# Patient Record
Sex: Male | Born: 1995 | Race: White | Hispanic: Yes | Marital: Single | Smoking: Never smoker
Health system: Southern US, Community
[De-identification: ages and names within clinical notes are randomized; demographics above are authoritative.]

---
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
## 2015-08-21 ENCOUNTER — Emergency Department (HOSPITAL_COMMUNITY)
Admission: EM | Admit: 2015-08-21 | Discharge: 2015-08-22 | Disposition: A | Discharge status: Home / Self Care | Payer: 59 | Attending: Emergency Medicine | Admitting: Emergency Medicine

## 2015-08-21 ENCOUNTER — Encounter (HOSPITAL_COMMUNITY): Payer: Self-pay | Admitting: *Deleted

## 2015-08-21 ENCOUNTER — Emergency Department (HOSPITAL_COMMUNITY): Payer: 59

## 2015-08-21 DIAGNOSIS — R05 Cough: Secondary | ICD-10-CM | POA: Diagnosis present

## 2015-08-21 DIAGNOSIS — F17200 Nicotine dependence, unspecified, uncomplicated: Secondary | ICD-10-CM | POA: Diagnosis not present

## 2015-08-21 DIAGNOSIS — J189 Pneumonia, unspecified organism: Secondary | ICD-10-CM

## 2015-08-21 DIAGNOSIS — J159 Unspecified bacterial pneumonia: Secondary | ICD-10-CM | POA: Diagnosis not present

## 2015-08-21 NOTE — ED Notes (Signed)
The party he was at was outside and he was there for 2 hours without a coat cold and coughing

## 2015-08-21 NOTE — ED Notes (Signed)
The pt has had a cold and cough for 2 weedks he was at a party tonight  And he  Did not have a coat  Runny nose hyperventilating.  Chest pain when he coughs.  Productive cough thick yellow mucousn  Worse for 2 days

## 2015-08-22 MED ORDER — AZITHROMYCIN 250 MG PO TABS
500.0000 mg | ORAL_TABLET | Freq: Once | ORAL | Status: AC
  Administered 2015-08-22: 500 mg via ORAL
  Filled 2015-08-22: qty 2

## 2015-08-22 MED ORDER — AZITHROMYCIN 250 MG PO TABS: 250.0000 mg | ORAL_TABLET | Freq: Every day | ORAL | Status: AC

## 2015-08-22 MED ORDER — HYDROCODONE-ACETAMINOPHEN 5-325 MG PO TABS: 1.0000 | ORAL_TABLET | ORAL | Status: AC | PRN

## 2015-08-22 MED ORDER — KETOROLAC TROMETHAMINE 60 MG/2ML IM SOLN
60.0000 mg | Freq: Once | INTRAMUSCULAR | Status: AC
  Administered 2015-08-22: 60 mg via INTRAMUSCULAR
  Filled 2015-08-22: qty 2

## 2015-08-22 NOTE — ED Notes (Signed)
Pt stable, ambulatory, states understanding of discharge instructions 

## 2015-08-22 NOTE — ED Provider Notes (Addendum)
CSN: [id number]     Arrival date & time 08/21/15  2222 History   First MD Initiated Contact with Patient 08/21/15 2355     Chief Complaint  Patient presents with  . URI     (Consider location/radiation/quality/duration/timing/severity/associated sxs/prior Treatment) Patient is a 20 y.o. male presenting with URI. The history is provided by the patient.  URI Presenting symptoms: congestion, cough and fever   Cough:    Cough characteristics:  Productive   Sputum characteristics:  Yellow   Severity:  Moderate   Onset quality:  Gradual   Duration:  2 weeks   Timing:  Constant   Progression:  Unchanged Severity:  Moderate Onset quality:  Gradual Timing:  Constant Progression:  Worsening Worsened by:  Nothing tried Ineffective treatments:  None tried Associated symptoms: no wheezing   Associated symptoms comment:  Tonight while patient was at a wedding he was outside in the cold when he started to feel chest pain and tightness. Now his chest is a 10 out of 10 sharp stabbing pain in the right side of his chest that is worse with deep breathing and coughing. He denies any nausea, vomiting. No recent hospitalizations or travel. No unilateral leg pain or swelling. Risk factors: no chronic kidney disease, no chronic respiratory disease, no diabetes mellitus, no recent travel and no sick contacts   Risk factors comment:  Does use tobacco and marijuana   History reviewed. No pertinent past medical history. History reviewed. No pertinent past surgical history. No family history on file. Social History  Substance Use Topics  . Smoking status: Never Smoker   . Smokeless tobacco: None  . Alcohol Use: Yes    Review of Systems  Constitutional: Positive for fever.  HENT: Positive for congestion.   Respiratory: Positive for cough. Negative for wheezing.   All other systems reviewed and are negative.     Allergies  Review of patient's allergies indicates no known allergies.  Home  Medications   Prior to Admission medications   Not on File   BP 136/81 mmHg  Pulse 92  Temp(Src) 98.5 F (36.9 C) (Oral)  Resp 24  Ht 6\' 2"  (1.88 m)  Wt 196 lb (88.905 kg)  BMI 25.15 kg/m2  SpO2 98% Physical Exam  Constitutional: He is oriented to person, place, and time. He appears well-developed and well-nourished. He appears distressed.  Appears uncomfortable  HENT:  Head: Normocephalic and atraumatic.  Mouth/Throat: Oropharynx is clear and moist.  Eyes: Conjunctivae and EOM are normal. Pupils are equal, round, and reactive to light.  Neck: Normal range of motion. Neck supple.  Cardiovascular: Normal rate, regular rhythm and intact distal pulses.   No murmur heard. Pulmonary/Chest: Effort normal and breath sounds normal. No respiratory distress. He has no wheezes. He has no rales. He exhibits tenderness. He exhibits no crepitus.    Abdominal: Soft. He exhibits no distension. There is no tenderness. There is no rebound and no guarding.  Musculoskeletal: Normal range of motion. He exhibits no edema or tenderness.  Neurological: He is alert and oriented to person, place, and time.  Skin: Skin is warm and dry. No rash noted. No erythema.  Psychiatric: He has a normal mood and affect. His behavior is normal.  Nursing note and vitals reviewed.   ED Course  Procedures (including critical care time) Labs Review Labs Reviewed - No data to display  Imaging Review Dg Chest 2 View  08/21/2015  CLINICAL DATA:  Right-sided chest pain and cough EXAM: CHEST  2 VIEW COMPARISON:  None. FINDINGS: Heart size is normal. No pleural effusion or edema. Airspace consolidation involving the right middle lobe is identified. Left lung is clear. IMPRESSION: 1. Right middle lobe pneumonia. Electronically Signed   By: Signa Kell M.D.   On: 08/21/2015 23:51   I have personally reviewed and evaluated these images and lab results as part of my medical decision-making.   EKG Interpretation None        MDM   Final diagnoses:  CAP (community acquired pneumonia)    Patient presenting with persistent 2 weeks of productive cough and fever that has resolved presenting today with chest pain that started tonight with persistent cough. Patient appears uncomfortable on exam and has pleuritic type pain. Oxygen saturation is 98% temperature is normal blood pressure is normal. He has slightly tachypnea but feel that is related to pain. He is a smoker and occasionally uses marijuana but denies a history of asthma or any significant medical problems. No recent travel or symptoms concerning for PE. On x-ray patient has a right middle lobe pneumonia which correlates with where he is feeling the pain. Patient has no risk factors for resistant pneumonia and was treated with azithromycin.    Gwyneth Sprout, MD 08/22/15 1610  Gwyneth Sprout, MD 08/22/15 [id number]

## 2016-10-28 IMAGING — CR DG CHEST 2V
2 series · 2 of 2 positions shown · non-contrast
Comparison: None.

CLINICAL DATA: Right-sided chest pain and cough

EXAM:
CHEST  2 VIEW

[chest pa]
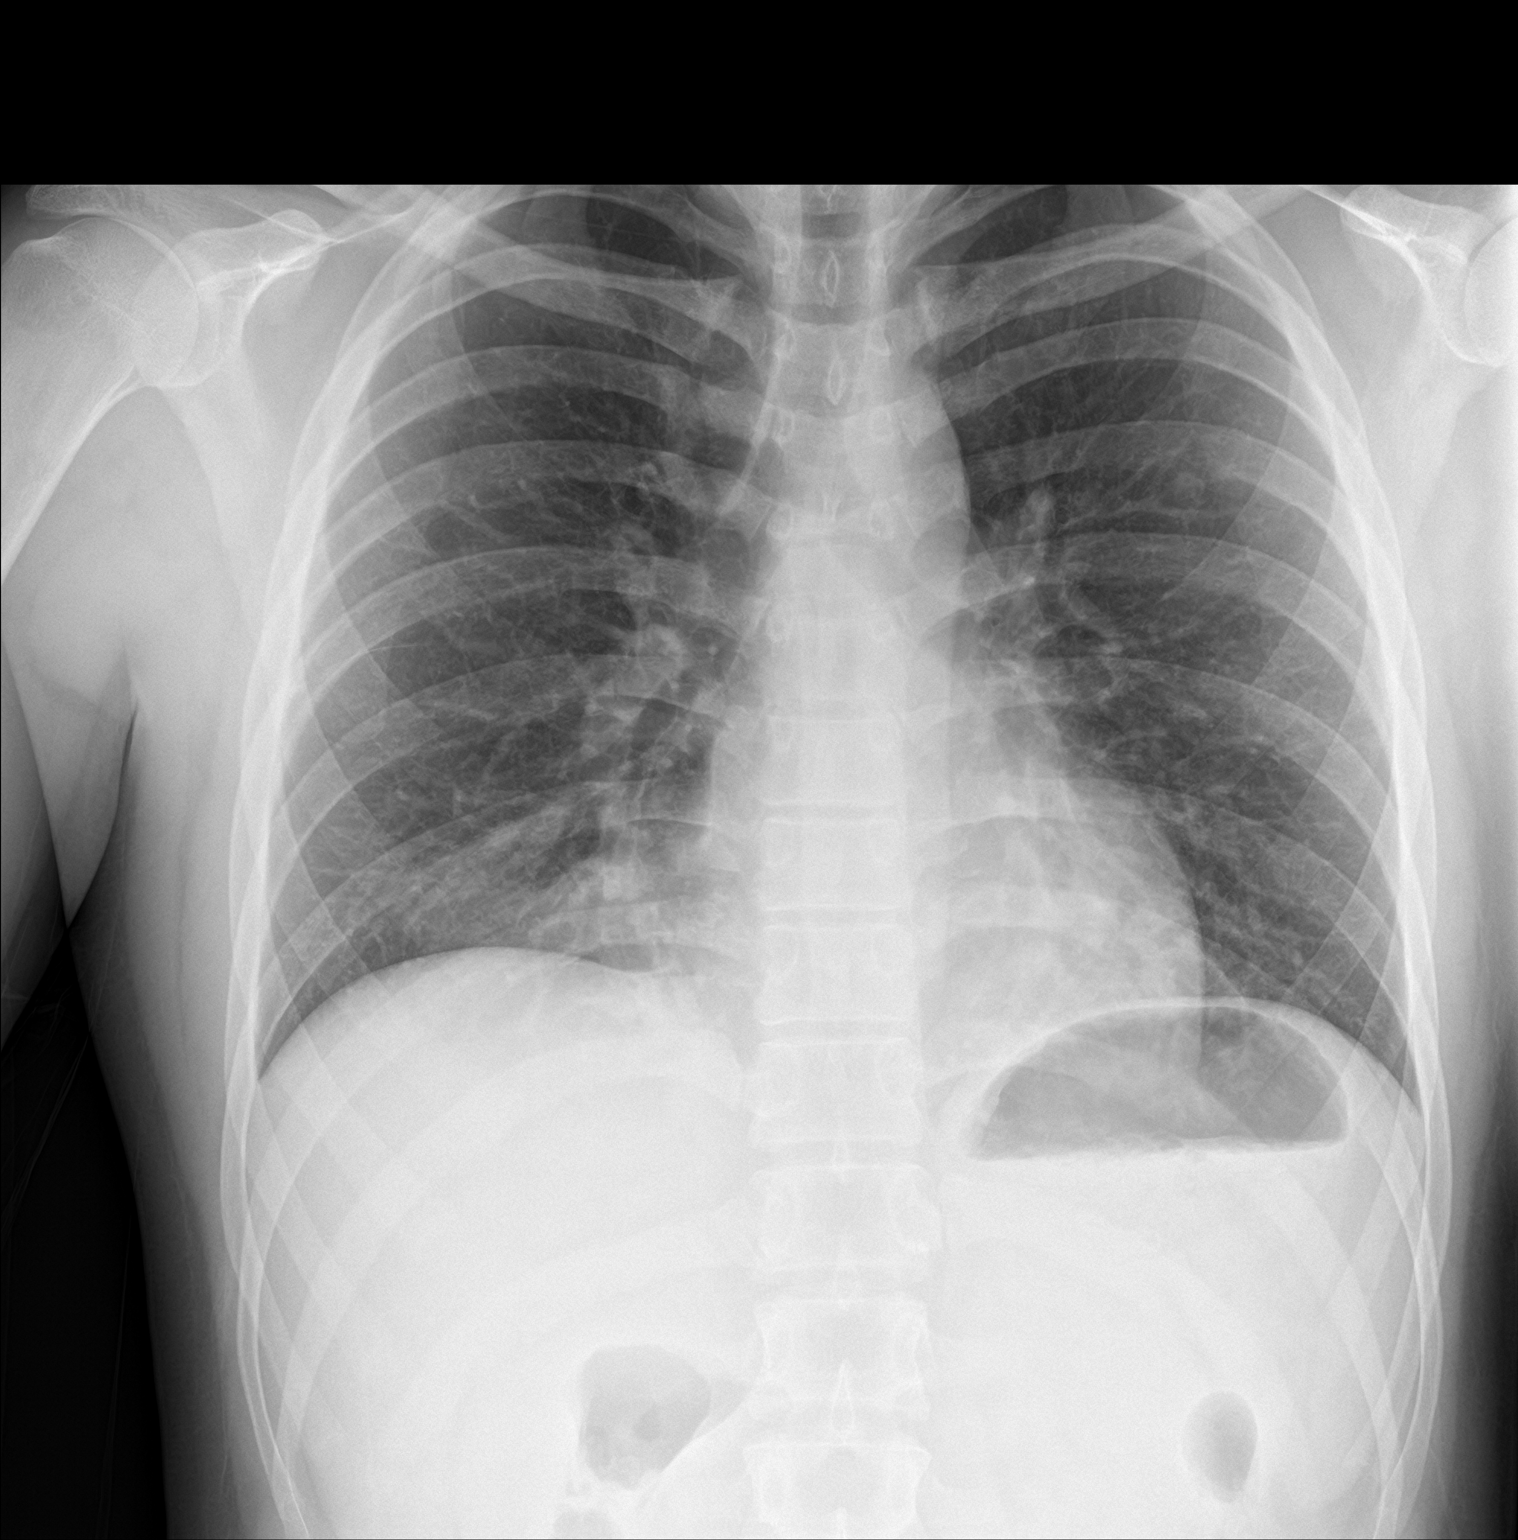

[chest lat]
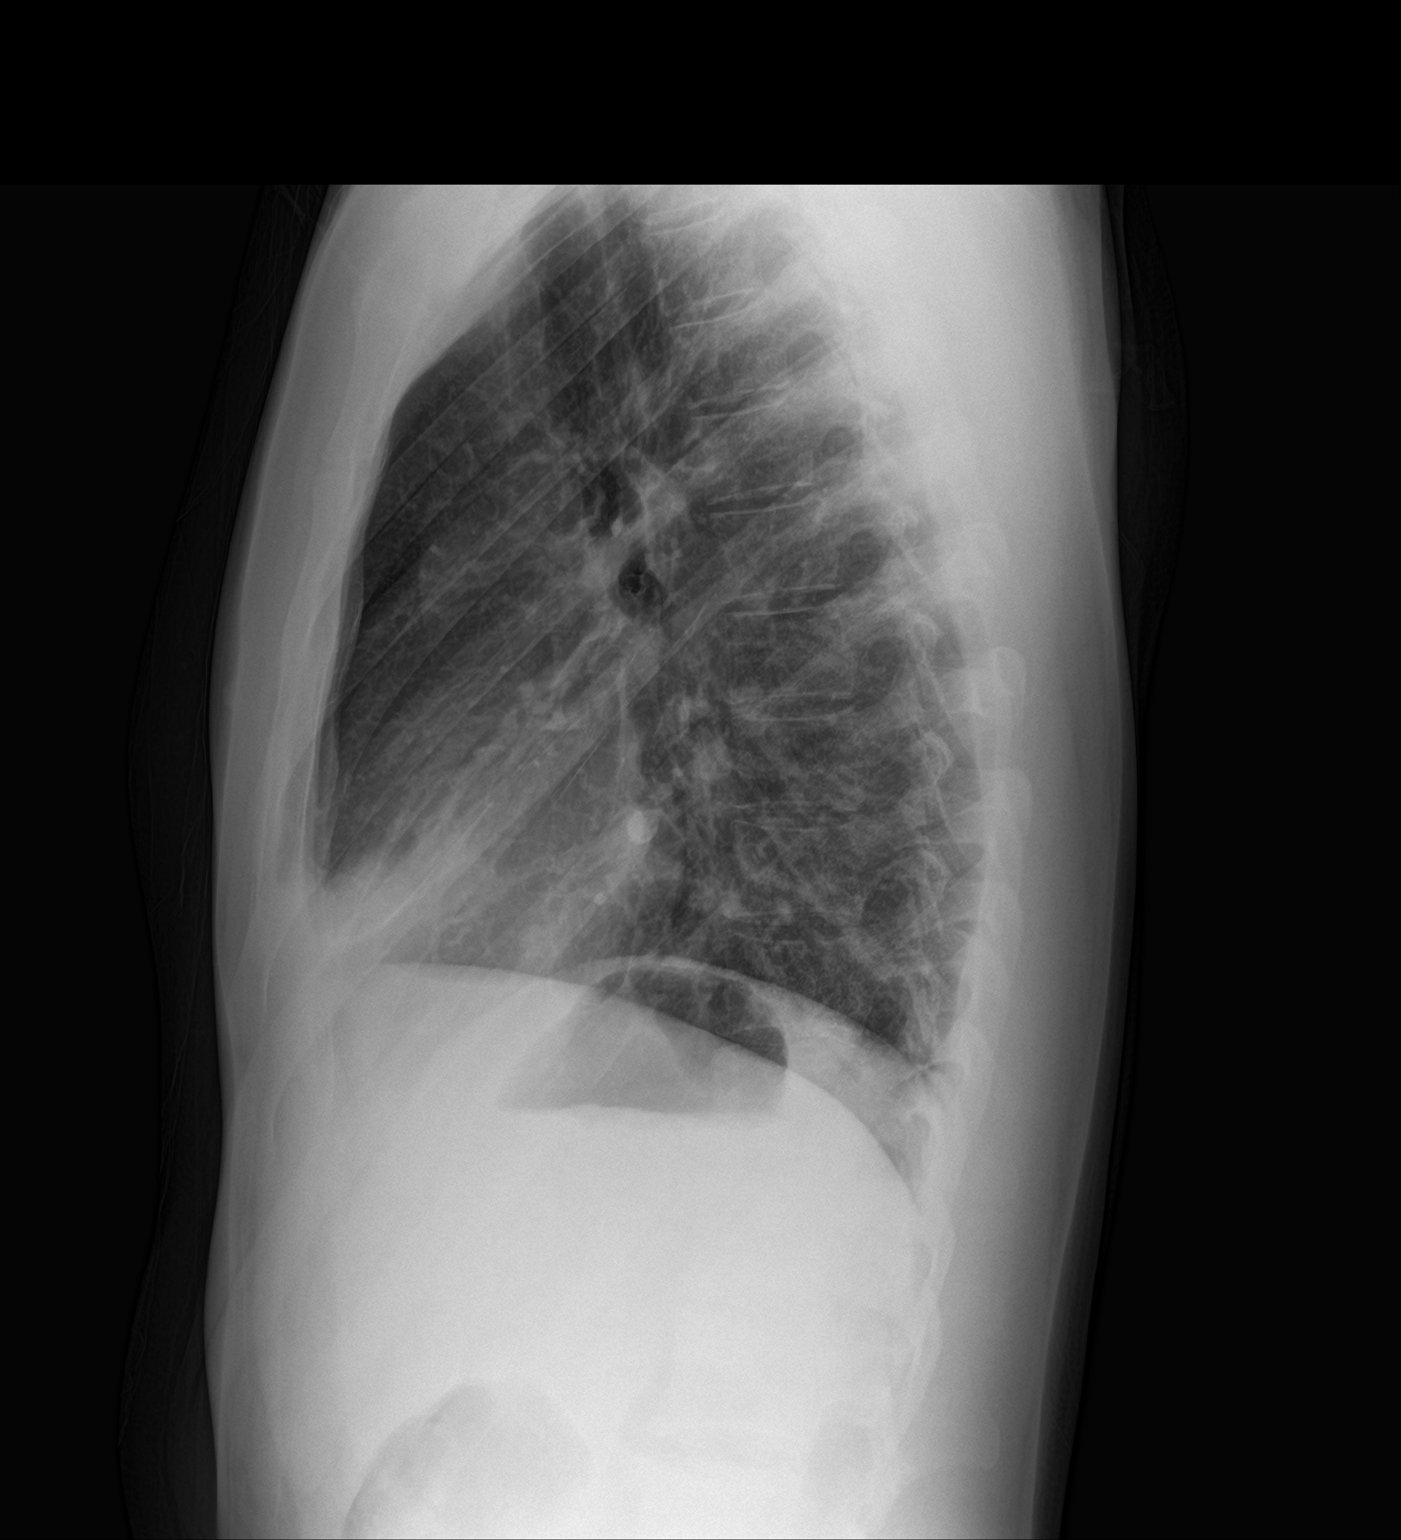

[2 of 2 positions shown; findings below may reference images not displayed]

FINDINGS: Heart size is normal. No pleural effusion or edema. Airspace
consolidation involving the right middle lobe is identified. Left
lung is clear.
IMPRESSION: 1. Right middle lobe pneumonia.
